# Patient Record
Sex: Female | Born: 1935 | Race: Black or African American | Hispanic: No | Marital: Married | Smoking: Never smoker
Health system: Southern US, Community
[De-identification: ages and names within clinical notes are randomized; demographics above are authoritative.]

## PROBLEM LIST (undated history)

## (undated) DIAGNOSIS — F0280 Dementia in other diseases classified elsewhere without behavioral disturbance: Secondary | ICD-10-CM

## (undated) DIAGNOSIS — I10 Essential (primary) hypertension: Secondary | ICD-10-CM

## (undated) DIAGNOSIS — F419 Anxiety disorder, unspecified: Secondary | ICD-10-CM

## (undated) DIAGNOSIS — F0390 Unspecified dementia without behavioral disturbance: Secondary | ICD-10-CM

## (undated) DIAGNOSIS — R1310 Dysphagia, unspecified: Secondary | ICD-10-CM

## (undated) DIAGNOSIS — E119 Type 2 diabetes mellitus without complications: Secondary | ICD-10-CM

## (undated) DIAGNOSIS — F329 Major depressive disorder, single episode, unspecified: Secondary | ICD-10-CM

## (undated) DIAGNOSIS — N183 Chronic kidney disease, stage 3 unspecified: Secondary | ICD-10-CM

## (undated) DIAGNOSIS — K219 Gastro-esophageal reflux disease without esophagitis: Secondary | ICD-10-CM

## (undated) DIAGNOSIS — M1990 Unspecified osteoarthritis, unspecified site: Secondary | ICD-10-CM

## (undated) HISTORY — DX: Unspecified osteoarthritis, unspecified site: M19.90

## (undated) HISTORY — DX: Gastro-esophageal reflux disease without esophagitis: K21.9

## (undated) HISTORY — DX: Chronic kidney disease, stage 3 unspecified: N18.30

## (undated) HISTORY — DX: Major depressive disorder, single episode, unspecified: F32.9

## (undated) HISTORY — DX: Dementia in other diseases classified elsewhere, unspecified severity, without behavioral disturbance, psychotic disturbance, mood disturbance, and anxiety: F02.80

## (undated) HISTORY — DX: Anxiety disorder, unspecified: F41.9

## (undated) HISTORY — DX: Dysphagia, unspecified: R13.10

---

## 2003-02-22 ENCOUNTER — Other Ambulatory Visit: Payer: Self-pay

## 2004-09-11 ENCOUNTER — Ambulatory Visit: Payer: Self-pay | Admitting: Nurse Practitioner

## 2004-09-24 ENCOUNTER — Ambulatory Visit: Payer: Self-pay | Admitting: Family Medicine

## 2016-11-11 ENCOUNTER — Emergency Department: Payer: Medicare Other

## 2016-11-11 ENCOUNTER — Observation Stay
Admission: EM | Admit: 2016-11-11 | Discharge: 2016-11-12 | Disposition: A | Discharge status: Home / Self Care | Payer: Medicare Other | Attending: Internal Medicine | Admitting: Internal Medicine

## 2016-11-11 ENCOUNTER — Encounter: Payer: Self-pay | Admitting: Emergency Medicine

## 2016-11-11 DIAGNOSIS — K219 Gastro-esophageal reflux disease without esophagitis: Secondary | ICD-10-CM | POA: Insufficient documentation

## 2016-11-11 DIAGNOSIS — I10 Essential (primary) hypertension: Secondary | ICD-10-CM | POA: Diagnosis not present

## 2016-11-11 DIAGNOSIS — R4182 Altered mental status, unspecified: Secondary | ICD-10-CM | POA: Diagnosis not present

## 2016-11-11 DIAGNOSIS — R531 Weakness: Secondary | ICD-10-CM | POA: Diagnosis not present

## 2016-11-11 DIAGNOSIS — M6281 Muscle weakness (generalized): Secondary | ICD-10-CM | POA: Insufficient documentation

## 2016-11-11 DIAGNOSIS — Z9181 History of falling: Secondary | ICD-10-CM | POA: Insufficient documentation

## 2016-11-11 DIAGNOSIS — R262 Difficulty in walking, not elsewhere classified: Secondary | ICD-10-CM | POA: Diagnosis not present

## 2016-11-11 DIAGNOSIS — E119 Type 2 diabetes mellitus without complications: Secondary | ICD-10-CM | POA: Diagnosis not present

## 2016-11-11 DIAGNOSIS — F0390 Unspecified dementia without behavioral disturbance: Secondary | ICD-10-CM | POA: Insufficient documentation

## 2016-11-11 DIAGNOSIS — R410 Disorientation, unspecified: Secondary | ICD-10-CM | POA: Diagnosis present

## 2016-11-11 DIAGNOSIS — Z79899 Other long term (current) drug therapy: Secondary | ICD-10-CM | POA: Diagnosis not present

## 2016-11-11 DIAGNOSIS — I639 Cerebral infarction, unspecified: Secondary | ICD-10-CM

## 2016-11-11 DIAGNOSIS — Z66 Do not resuscitate: Secondary | ICD-10-CM | POA: Insufficient documentation

## 2016-11-11 HISTORY — DX: Unspecified dementia, unspecified severity, without behavioral disturbance, psychotic disturbance, mood disturbance, and anxiety: F03.90

## 2016-11-11 HISTORY — DX: Essential (primary) hypertension: I10

## 2016-11-11 HISTORY — DX: Type 2 diabetes mellitus without complications: E11.9

## 2016-11-11 LAB — URINALYSIS, COMPLETE (UACMP) WITH MICROSCOPIC
BACTERIA UA: NONE SEEN
BILIRUBIN URINE: NEGATIVE
GLUCOSE, UA: 150 mg/dL — AB
Hgb urine dipstick: NEGATIVE
KETONES UR: NEGATIVE mg/dL
LEUKOCYTES UA: NEGATIVE
NITRITE: NEGATIVE
PH: 8 (ref 5.0–8.0)
Protein, ur: 100 mg/dL — AB
Specific Gravity, Urine: 1.005 (ref 1.005–1.030)
WBC, UA: NONE SEEN WBC/hpf (ref 0–5)

## 2016-11-11 LAB — CBC WITH DIFFERENTIAL/PLATELET
Basophils Absolute: 0 10*3/uL (ref 0–0.1)
Basophils Relative: 1 %
EOS ABS: 0 10*3/uL (ref 0–0.7)
EOS PCT: 1 %
HCT: 36 % (ref 35.0–47.0)
Hemoglobin: 12 g/dL (ref 12.0–16.0)
LYMPHS ABS: 0.8 10*3/uL — AB (ref 1.0–3.6)
Lymphocytes Relative: 18 %
MCH: 31.9 pg (ref 26.0–34.0)
MCHC: 33.3 g/dL (ref 32.0–36.0)
MCV: 95.7 fL (ref 80.0–100.0)
Monocytes Absolute: 0.4 10*3/uL (ref 0.2–0.9)
Monocytes Relative: 9 %
Neutro Abs: 3.3 10*3/uL (ref 1.4–6.5)
Neutrophils Relative %: 71 %
PLATELETS: 165 10*3/uL (ref 150–440)
RBC: 3.76 MIL/uL — AB (ref 3.80–5.20)
RDW: 12.8 % (ref 11.5–14.5)
WBC: 4.6 10*3/uL (ref 3.6–11.0)

## 2016-11-11 LAB — COMPREHENSIVE METABOLIC PANEL
ALT: 49 U/L (ref 14–54)
ANION GAP: 7 (ref 5–15)
AST: 43 U/L — ABNORMAL HIGH (ref 15–41)
Albumin: 3.9 g/dL (ref 3.5–5.0)
Alkaline Phosphatase: 52 U/L (ref 38–126)
BUN: 21 mg/dL — ABNORMAL HIGH (ref 6–20)
CO2: 28 mmol/L (ref 22–32)
Calcium: 9.3 mg/dL (ref 8.9–10.3)
Chloride: 102 mmol/L (ref 101–111)
Creatinine, Ser: 0.94 mg/dL (ref 0.44–1.00)
GFR calc non Af Amer: 51 mL/min — ABNORMAL LOW (ref >60)
GFR, EST AFRICAN AMERICAN: 59 mL/min — AB (ref >60)
GLUCOSE: 234 mg/dL — AB (ref 65–99)
POTASSIUM: 3.9 mmol/L (ref 3.5–5.1)
SODIUM: 137 mmol/L (ref 135–145)
TOTAL PROTEIN: 6.8 g/dL (ref 6.5–8.1)
Total Bilirubin: 0.6 mg/dL (ref 0.3–1.2)

## 2016-11-11 LAB — TROPONIN I

## 2016-11-11 LAB — GLUCOSE, CAPILLARY: Glucose-Capillary: 223 mg/dL — ABNORMAL HIGH (ref 65–99)

## 2016-11-11 MED ORDER — ENOXAPARIN SODIUM 30 MG/0.3ML ~~LOC~~ SOLN
30.0000 mg | SUBCUTANEOUS | Status: DC
  Administered 2016-11-12: 01:00:00 30 mg via SUBCUTANEOUS
  Filled 2016-11-11: qty 0.3

## 2016-11-11 MED ORDER — LISINOPRIL 20 MG PO TABS
20.0000 mg | ORAL_TABLET | Freq: Every day | ORAL | Status: DC
  Administered 2016-11-12: 20 mg via ORAL
  Filled 2016-11-11: qty 1

## 2016-11-11 MED ORDER — TRAZODONE HCL 50 MG PO TABS
25.0000 mg | ORAL_TABLET | Freq: Every day | ORAL | Status: DC
Start: 2016-11-11 — End: 2016-11-12

## 2016-11-11 MED ORDER — SODIUM CHLORIDE 0.9 % IV SOLN
Freq: Once | INTRAVENOUS | Status: AC
  Administered 2016-11-11: 18:00:00 via INTRAVENOUS

## 2016-11-11 MED ORDER — ONDANSETRON HCL 4 MG PO TABS: 4.0000 mg | ORAL_TABLET | Freq: Four times a day (QID) | ORAL | Status: DC | PRN

## 2016-11-11 MED ORDER — HYDRALAZINE HCL 20 MG/ML IJ SOLN
10.0000 mg | Freq: Four times a day (QID) | INTRAMUSCULAR | Status: DC | PRN
  Administered 2016-11-11: 10 mg via INTRAVENOUS
  Filled 2016-11-11: qty 1

## 2016-11-11 MED ORDER — AMLODIPINE BESYLATE 5 MG PO TABS
2.5000 mg | ORAL_TABLET | Freq: Every day | ORAL | Status: DC
  Administered 2016-11-12: 2.5 mg via ORAL
  Filled 2016-11-11: qty 1

## 2016-11-11 MED ORDER — PANTOPRAZOLE SODIUM 40 MG PO TBEC
40.0000 mg | DELAYED_RELEASE_TABLET | Freq: Every day | ORAL | Status: DC
  Administered 2016-11-12: 40 mg via ORAL
  Filled 2016-11-11: qty 1

## 2016-11-11 MED ORDER — ONDANSETRON HCL 4 MG/2ML IJ SOLN: 4.0000 mg | Freq: Four times a day (QID) | INTRAMUSCULAR | Status: DC | PRN

## 2016-11-11 MED ORDER — ACETAMINOPHEN 650 MG RE SUPP: 650.0000 mg | Freq: Four times a day (QID) | RECTAL | Status: DC | PRN

## 2016-11-11 MED ORDER — HALOPERIDOL LACTATE 5 MG/ML IJ SOLN: 2.5000 mg | Freq: Four times a day (QID) | INTRAMUSCULAR | Status: DC | PRN

## 2016-11-11 MED ORDER — ACETAMINOPHEN 325 MG PO TABS: 650.0000 mg | ORAL_TABLET | Freq: Four times a day (QID) | ORAL | Status: DC | PRN

## 2016-11-11 NOTE — ED Notes (Signed)
Report to Nettie ElmSylvia, Charity fundraiserN, denies concerns at this time. Encouraged to call back with questions

## 2016-11-11 NOTE — ED Provider Notes (Signed)
[facility] Emergency Department Provider Note       Time seen: ----------------------------------------- 4:16 PM on 11/11/2016 -----------------------------------------     I have reviewed the triage vital signs and the nursing notes.   HISTORY   Chief Complaint Possible stroke    HPI Bonnie Coffey is a 83 y.o. female who presents to the ED for confusion and concerns for stroke. [facility]. The family reports they took her to the [employer] to have lunch and she was confused. Patient reports his soup had upset her stomach. There is not any specific weakness, speech difficulty or focal neurologic complaint. Family states she is more confused than normal perhaps.   Past Medical History:  Diagnosis Date  . Dementia   . Diabetes mellitus without complication (HCC)   . Hypertension     There are no active problems to display for this patient.   No past surgical history on file.  Allergies Patient has no known allergies.  Social History Social History  Substance Use Topics  . Smoking status: Not on file  . Smokeless tobacco: Not on file  . Alcohol use Not on file    Review of Systems Unknown, baseline history of dementia, currently with some confusion.  All systems negative/normal/unremarkable except as stated in the HPI  ____________________________________________   PHYSICAL EXAM:  VITAL SIGNS: ED Triage Vitals [11/11/16 1610]  Enc Vitals Group     BP (!) 169/72     Pulse Rate 76     Resp 18     Temp 98.5 F (36.9 C)     Temp Source Oral     SpO2 100 %     Weight 103 lb (46.7 kg)     Height 5' (1.524 m)     Head Circumference      Peak Flow      Pain Score      Pain Loc      Pain Edu?      Excl. in GC?     Constitutional: Alert but disoriented. Well appearing and in no distress. Eyes: Conjunctivae are normal. Normal extraocular movements. ENT   Head: Normocephalic and  atraumatic.   Nose: No congestion/rhinnorhea.   Mouth/Throat: Mucous membranes are moist.   Neck: No stridor. Cardiovascular: Normal rate, regular rhythm. No murmurs, rubs, or gallops. Respiratory: Normal respiratory effort without tachypnea nor retractions. Breath sounds are clear and equal bilaterally. No wheezes/rales/rhonchi. Gastrointestinal: Soft and nontender. Normal bowel sounds Musculoskeletal: Nontender with normal range of motion in extremities. No lower extremity tenderness nor edema. Neurologic:  Confusion, No gross focal neurologic deficits are appreciated. Patient has difficulty following commands for neurologic testing Skin:  Skin is warm, dry and intact. No rash noted. Psychiatric: Mood and affect are normal.  ____________________________________________  EKG: Interpreted by me. Sinus rhythm with a rate of 79 bpm, normal PR interval, normal QRS, normal QT. Nonspecific ST segment changes.  ____________________________________________  ED COURSE:  Pertinent labs & imaging results that were available during my care of the patient were reviewed by me and considered in my medical decision making (see chart for details). Patient presents for confusion, we will assess with labs and imaging as indicated.   Procedures ____________________________________________   LABS (pertinent positives/negatives)  Labs Reviewed  CBC WITH DIFFERENTIAL/PLATELET - Abnormal; Notable for the following:       Result Value   RBC 3.76 (*)    Lymphs Abs 0.8 (*)  All other components within normal limits  COMPREHENSIVE METABOLIC PANEL - Abnormal; Notable for the following:    Glucose, Bld 234 (*)    BUN 21 (*)    AST 43 (*)    GFR calc non Af Amer 51 (*)    GFR calc Af Amer 59 (*)    All other components within normal limits  URINALYSIS, COMPLETE (UACMP) WITH MICROSCOPIC - Abnormal; Notable for the following:    Color, Urine COLORLESS (*)    APPearance CLEAR (*)    Glucose, UA  150 (*)    Protein, ur 100 (*)    Squamous Epithelial / LPF 0-5 (*)    All other components within normal limits  GLUCOSE, CAPILLARY - Abnormal; Notable for the following:    Glucose-Capillary 223 (*)    All other components within normal limits  TROPONIN I  CBG MONITORING, ED    RADIOLOGY Images were viewed by me  CT head IMPRESSION: 1. No evidence of acute intracranial abnormality. 2. Moderate chronic small vessel ischemic disease.  ____________________________________________  FINAL ASSESSMENT AND PLAN  Altered mental status  Plan: Patient's labs and imaging were dictated above. Patient had presented for Altered mental status of uncertain etiology, likely secondary to dementia. Her labs and workup have been reassuring. I will discuss with the hospitalist for observation.   Emily FilbertWilliams, Ebone Alcivar E, MD   Note: This note was generated in part or whole with voice recognition software. Voice recognition is usually quite accurate but there are transcription errors that can and very often do occur. I apologize for any typographical errors that were not detected and corrected.     Emily FilbertWilliams, Kaliope Quinonez E, MD 11/11/16 [phone]

## 2016-11-11 NOTE — Progress Notes (Signed)
Pharmacy dose adjustment for lovenox. Patient admitted for AMS. Being started on DVT prophylaxis w/ lovenox. Ordered dose lovenox 40 mg subq daily.  Pt's CrCl 26.9 ml/min.  Will decrease dose to lovenox 30 mg subq daily for CrCl < 30 ml/min.  Thomasene Rippleavid Balinda Heacock, PharmD, BCPS Clinical Pharmacist 11/11/2016

## 2016-11-11 NOTE — H&P (Signed)
[facility] [place] [facility]   PATIENT NAME: Bonnie Coffey    MR#:  [id number]  DATE OF BIRTH:  Feb 08, 1921  DATE OF ADMISSION:  11/11/2016  PRIMARY CARE PHYSICIAN: [facility]   REQUESTING/REFERRING PHYSICIAN: Dr. Daryel NovemberJonathan Williams  CHIEF COMPLAINT:   Chief Complaint  Patient presents with  . Possible stroke    HISTORY OF PRESENT ILLNESS:  Bonnie MantleLillie Haluska  is a 83 y.o. female with a known history of Dementia, diabetes, hypertension who presents to the hospital due to altered mental status. Patient was brought into the hospital by her family as she herself is a very poor historian. As per the family patient is not her usual self today. She normally is able to communicate but today she's been more lethargic, she's had some slurred speech, and she seems more confused than normal. She is able to normally recognize people and she can't do that today either. She came to the ER underwent an extensive workup including a CT head, metabolic workup which has so far been negative. She has no other infectious process. She continues to be somewhat altered and family does not feel comfortable taking her home and therefore hospitalist services were contacted further treatment and evaluation. Patient incidentally was also noted to be significantly hypertensive while being in the emergency room.  PAST MEDICAL HISTORY:   Past Medical History:  Diagnosis Date  . Dementia   . Diabetes mellitus without complication (HCC)   . Hypertension     PAST SURGICAL HISTORY:  No past surgical history on file.  SOCIAL HISTORY:   Social History  Substance Use Topics  . Smoking status: Never Smoker  . Smokeless tobacco: Not on file  . Alcohol use No    FAMILY HISTORY:   Family History  Problem Relation Age of Onset  . Heart disease Father     DRUG ALLERGIES:  No Known Allergies  REVIEW OF SYSTEMS:   Review of Systems  Unable to perform ROS: Dementia     MEDICATIONS AT HOME:   Prior to Admission medications   Medication Sig Start Date End Date Taking? Authorizing Provider  amLODipine (NORVASC) 2.5 MG tablet Take 2.5 mg by mouth daily. 10/21/16  Yes [provider]  lisinopril (PRINIVIL,ZESTRIL) 20 MG tablet Take 20 mg by mouth daily. 10/22/16  Yes [provider]  omeprazole (PRILOSEC) 20 MG capsule Take 20 mg by mouth daily. 10/22/16  Yes [provider]  traZODone (DESYREL) 50 MG tablet Take 25 mg by mouth at bedtime. 09/29/16  Yes [provider]      VITAL SIGNS:  Blood pressure (!) 169/72, pulse 89, temperature 98.5 F (36.9 C), temperature source Oral, resp. rate 14, height 5' (1.524 m), weight 46.7 kg (103 lb), SpO2 100 %.  PHYSICAL EXAMINATION:  Physical Exam  GENERAL:  83 y.o.-year-old patient lying in the bed Lethargic but in no apparent distress and follows simple commands.  EYES: Pupils equal, round, reactive to light and accommodation. No scleral icterus. Extraocular muscles intact.  HEENT: Head atraumatic, normocephalic. Oropharynx and nasopharynx clear. No oropharyngeal erythema, moist oral mucosa  NECK:  Supple, no jugular venous distention. No thyroid enlargement, no tenderness.  LUNGS: Normal breath sounds bilaterally, no wheezing, rales, rhonchi. No use of accessory muscles of respiration.  CARDIOVASCULAR: S1, S2 RRR. No murmurs, rubs, gallops, clicks.  ABDOMEN: Soft, nontender, nondistended. Bowel sounds present. No organomegaly or mass.  EXTREMITIES: No pedal edema, cyanosis, or clubbing. + 2 pedal & radial  pulses b/l.   NEUROLOGIC: Cranial nerves II through XII are intact. No focal Motor or sensory deficits appreciated b/l. Globally weak.  PSYCHIATRIC: The patient is alert and oriented x 1. SKIN: No obvious rash, lesion, or ulcer.   LABORATORY PANEL:   CBC  Recent Labs Lab 11/11/16 1620  WBC 4.6  HGB 12.0  HCT 36.0  PLT 165    ------------------------------------------------------------------------------------------------------------------  Chemistries   Recent Labs Lab 11/11/16 1620  NA 137  K 3.9  CL 102  CO2 28  GLUCOSE 234*  BUN 21*  CREATININE 0.94  CALCIUM 9.3  AST 43*  ALT 49  ALKPHOS 52  BILITOT 0.6   ------------------------------------------------------------------------------------------------------------------  Cardiac Enzymes  Recent Labs Lab 11/11/16 1620  TROPONINI <0.03   ------------------------------------------------------------------------------------------------------------------  RADIOLOGY:  Ct Head Wo Contrast  Result Date: 11/11/2016 CLINICAL DATA:  Altered mental status.  Possible stroke. EXAM: CT HEAD WITHOUT CONTRAST TECHNIQUE: Contiguous axial images were obtained from the base of the skull through the vertex without intravenous contrast. COMPARISON:  None. FINDINGS: Brain: There is no evidence of acute infarct, intracranial hemorrhage, mass, midline shift, or extra-axial fluid collection. Age-appropriate cerebral atrophy is present. Patchy to confluent bilateral cerebral white matter hypodensities are nonspecific but compatible with moderate chronic small vessel ischemic disease. Vascular: Calcified atherosclerosis at the skullbase. No hyperdense vessel. Skull: No fracture or focal osseous lesion. Sinuses/Orbits: Visualized paranasal sinuses and mastoid air cells are clear. Visualized orbits are unremarkable. Other: None. IMPRESSION: 1. No evidence of acute intracranial abnormality. 2. Moderate chronic small vessel ischemic disease. Electronically Signed   By: Sebastian AcheAllen  Grady M.D.   On: 11/11/2016 17:11     IMPRESSION AND PLAN:   83 year old female with past history of dementia, hypertension, diabetes and presents to the hospital due to altered mental status.  1. Altered mental status-etiology unclear presently. CT head negative on admission, urinalysis negative for  UTI. -Suspected this is secondary to progressive dementia. -We'll observe overnight, get MRI of the brain to rule out CVA and she is slightly hypertensive. -Social work consult for possible placement, physical therapy consult.  2. Essential hypertension-slightly accelerated now. -Continue amlodipine, lisinopril, will add some IV hydralazine as needed.  3. GERD-cont. Protonix.   4. Dementia-high risk for sundowning. Continue trazodone, will add some IV Haldol for agitation.    All the records are reviewed and case discussed with ED provider. Management plans discussed with the patient, family and they are in agreement.  CODE STATUS: DNR  TOTAL TIME TAKING CARE OF THIS PATIENT: 40 minutes.    Houston SirenSAINANI,VIVEK J M.D on 11/11/2016 at 8:10 PM  Between 7am to 6pm - Pager - [phone]  After 6pm go to [email or web address] - password [email or web address] [email or web address]  [facility]  Office  [phone]  CC: Primary care physician; [facility]

## 2016-11-11 NOTE — ED Triage Notes (Signed)
Pt sent from [facility] for evaluation of possible stroke. Pt family reports they took her out today to eat at approximately 1:00 pm and pt told them she didn't feel well and seemed more confused than normal. Pt niece states she did not know how to use utensils at lunch and this is new. Pt with bilateral strong hand grips, facial symmetry and no arm drift noted in triage.

## 2016-11-11 NOTE — ED Notes (Signed)
Assisted the patient with urinating via the bed pan.

## 2016-11-12 ENCOUNTER — Observation Stay: Payer: Medicare Other

## 2016-11-12 DIAGNOSIS — R4182 Altered mental status, unspecified: Secondary | ICD-10-CM | POA: Diagnosis not present

## 2016-11-12 MED ORDER — AMLODIPINE BESYLATE 5 MG PO TABS: 5.0000 mg | ORAL_TABLET | Freq: Every day | ORAL | 0 refills | Status: AC

## 2016-11-12 NOTE — Care Management Obs Status (Signed)
MEDICARE OBSERVATION STATUS NOTIFICATION   Patient Details  Name: Bonnie Coffey MRN: [id number] Date of Birth: November 10, 1920   Medicare Observation Status Notification Given:  Yes    Marily MemosLisa M Jomaira Darr, RN 11/12/2016, 8:41 AM

## 2016-11-12 NOTE — Care Management Note (Signed)
Case Management Note  Patient Details  Name: Bonnie Coffey MRN: [id number] Date of Birth: 10/17/1920  Subjective/Objective:  Observation patient. No home health resumption of care needed.   Action/Plan:   Expected Discharge Date:  11/13/16               Expected Discharge Plan:  Home w Home Health Services  In-House Referral:     Discharge planning Services  CM Consult  Post Acute Care Choice:  Home Health Choice offered to:     DME Arranged:    DME Agency:     HH Arranged:    HH Agency:     Status of Service:  Completed, signed off  If discussed at [employer] Meetings, dates discussed:    Additional Comments:  Bonnie MemosLisa M Meris Reede, RN 11/12/2016, 3:43 PM

## 2016-11-12 NOTE — Discharge Summary (Signed)
[facility] [place] at [facility]   PATIENT NAME: Bonnie Coffey    MR#:  [id number]  DATE OF BIRTH:  11/06/1920  DATE OF ADMISSION:  11/11/2016 ADMITTING PHYSICIAN: Houston Siren, MD  DATE OF DISCHARGE: 11/12/2016  PRIMARY CARE PHYSICIAN: Center, [facility]    ADMISSION DIAGNOSIS:  Altered mental status, unspecified altered mental status type [R41.82]  DISCHARGE DIAGNOSIS:  Active Problems:   Altered mental status   SECONDARY DIAGNOSIS:   Past Medical History:  Diagnosis Date  . Dementia   . Diabetes mellitus without complication (HCC)   . Hypertension     HOSPITAL COURSE:   83 year old female with past history of dementia, hypertension, diabetes and presents to the hospital due to altered mental status.  1. Altered mental status-etiology unclear presently. CT head negative on admission, urinalysis negative for UTI. -Suspected this is secondary to progressive dementia. - MRI of the brain to rule out CVA - negative. - BP was very high, likely the symptoms were due to that. - PT saw her- suggested HHA and 24 hr assist.  2. Essential hypertension- accelerated on admission. -Continue amlodipine, lisinopril, add some IV hydralazine as needed.   Now controlled, increase amlodipine dose on d/c.  3. GERD-cont. Protonix.   4. Dementia-high risk for sundowning. Continue trazodone, will add some IV Haldol for agitation.  DISCHARGE CONDITIONS:   Stable.  CONSULTS OBTAINED:    DRUG ALLERGIES:  No Known Allergies  DISCHARGE MEDICATIONS:   Current Discharge Medication List    CONTINUE these medications which have CHANGED   Details  amLODipine (NORVASC) 5 MG tablet Take 1 tablet (5 mg total) by mouth daily. Qty: 30 tablet, Refills: 0      CONTINUE these medications which have NOT CHANGED   Details  lisinopril (PRINIVIL,ZESTRIL) 20 MG tablet Take 20 mg by mouth daily.    omeprazole (PRILOSEC) 20 MG capsule Take 20 mg  by mouth daily.    traZODone (DESYREL) 50 MG tablet Take 25 mg by mouth at bedtime.         DISCHARGE INSTRUCTIONS:   Follow with PMD in 1-2 weeks.  If you experience worsening of your admission symptoms, develop shortness of breath, life threatening emergency, suicidal or homicidal thoughts you must seek medical attention immediately by calling 911 or calling your MD immediately  if symptoms less severe.  You Must read complete instructions/literature along with all the possible adverse reactions/side effects for all the Medicines you take and that have been prescribed to you. Take any new Medicines after you have completely understood and accept all the possible adverse reactions/side effects.   Please note  You were cared for by a hospitalist during your hospital stay. If you have any questions about your discharge medications or the care you received while you were in the hospital after you are discharged, you can call the unit and asked to speak with the hospitalist on call if the hospitalist that took care of you is not available. Once you are discharged, your primary care physician will handle any further medical issues. Please note that NO REFILLS for any discharge medications will be authorized once you are discharged, as it is imperative that you return to your primary care physician (or establish a relationship with a primary care physician if you do not have one) for your aftercare needs so that they can reassess your need for medications and monitor your lab values.    Today   CHIEF COMPLAINT:  Chief Complaint  Patient presents with  . Possible stroke    HISTORY OF PRESENT ILLNESS:  Bonnie Coffey  is a 83 y.o. female with a known history of Dementia, diabetes, hypertension who presents to the hospital due to altered mental status. Patient was brought into the hospital by her family as she herself is a very poor historian. As per the family patient is not her usual self  today. She normally is able to communicate but today she's been more lethargic, she's had some slurred speech, and she seems more confused than normal. She is able to normally recognize people and she can't do that today either. She came to the ER underwent an extensive workup including a CT head, metabolic workup which has so far been negative. She has no other infectious process. She continues to be somewhat altered and family does not feel comfortable taking her home and therefore hospitalist services were contacted further treatment and evaluation. Patient incidentally was also noted to be significantly hypertensive while being in the emergency room.   VITAL SIGNS:  Blood pressure (!) 156/73, pulse 80, temperature 97.7 F (36.5 C), temperature source Oral, resp. rate 16, height 5' (1.524 m), weight 43.2 kg (95 lb 3.2 oz), SpO2 100 %.  I/O:   Intake/Output Summary (Last 24 hours) at 11/12/16 1544 Last data filed at 11/12/16 1352  Gross per 24 hour  Intake              360 ml  Output             1000 ml  Net             -640 ml    PHYSICAL EXAMINATION:  GENERAL:  83 y.o.-year-old patient lying in the bed with no acute distress.  EYES: Pupils equal, round, reactive to light and accommodation. No scleral icterus. Extraocular muscles intact.  HEENT: Head atraumatic, normocephalic. Oropharynx and nasopharynx clear.  NECK:  Supple, no jugular venous distention. No thyroid enlargement, no tenderness.  LUNGS: Normal breath sounds bilaterally, no wheezing, rales,rhonchi or crepitation. No use of accessory muscles of respiration.  CARDIOVASCULAR: S1, S2 normal. No murmurs, rubs, or gallops.  ABDOMEN: Soft, non-tender, non-distended. Bowel sounds present. No organomegaly or mass.  EXTREMITIES: No pedal edema, cyanosis, or clubbing.  NEUROLOGIC: Cranial nerves II through XII are intact. Muscle strength 5/5 in all extremities. Sensation intact. Gait not checked.  PSYCHIATRIC: The patient is alert  and oriented x 2.  SKIN: No obvious rash, lesion, or ulcer.   DATA REVIEW:   CBC  Recent Labs Lab 11/11/16 1620  WBC 4.6  HGB 12.0  HCT 36.0  PLT 165    Chemistries   Recent Labs Lab 11/11/16 1620  NA 137  K 3.9  CL 102  CO2 28  GLUCOSE 234*  BUN 21*  CREATININE 0.94  CALCIUM 9.3  AST 43*  ALT 49  ALKPHOS 52  BILITOT 0.6    Cardiac Enzymes  Recent Labs Lab 11/11/16 1620  TROPONINI <0.03    Microbiology Results  No results found for this or any previous visit.  RADIOLOGY:  Ct Head Wo Contrast  Result Date: 11/11/2016 CLINICAL DATA:  Altered mental status.  Possible stroke. EXAM: CT HEAD WITHOUT CONTRAST TECHNIQUE: Contiguous axial images were obtained from the base of the skull through the vertex without intravenous contrast. COMPARISON:  None. FINDINGS: Brain: There is no evidence of acute infarct, intracranial hemorrhage, mass, midline shift, or extra-axial fluid collection. Age-appropriate cerebral atrophy is present.  Patchy to confluent bilateral cerebral white matter hypodensities are nonspecific but compatible with moderate chronic small vessel ischemic disease. Vascular: Calcified atherosclerosis at the skullbase. No hyperdense vessel. Skull: No fracture or focal osseous lesion. Sinuses/Orbits: Visualized paranasal sinuses and mastoid air cells are clear. Visualized orbits are unremarkable. Other: None. IMPRESSION: 1. No evidence of acute intracranial abnormality. 2. Moderate chronic small vessel ischemic disease. Electronically Signed   By: Sebastian AcheAllen  Grady M.D.   On: 11/11/2016 17:11   Mr Brain Wo Contrast  Result Date: 11/12/2016 CLINICAL DATA:  Cerebral infarction. Dementia, diabetes, hypertension, altered mental status EXAM: MRI HEAD WITHOUT CONTRAST TECHNIQUE: Multiplanar, multiecho pulse sequences of the brain and surrounding structures were obtained without intravenous contrast. COMPARISON:  CT head 11/11/2016 FINDINGS: Brain: Image quality degraded by  motion. Negative for acute infarct. Generalized atrophy with moderate chronic microvascular ischemic changes in the white matter, thalamus, and pons. Negative for hemorrhage or mass lesion. Negative for hydrocephalus. Vascular: Normal arterial flow voids. Skull and upper cervical spine: Negative Sinuses/Orbits: Negative Other: None IMPRESSION: Atrophy and chronic microvascular ischemia.  No acute abnormality. Electronically Signed   By: Marlan Palauharles  Clark M.D.   On: 11/12/2016 12:26    EKG:   Orders placed or performed during the hospital encounter of 11/11/16  . ED EKG  . ED EKG  . EKG 12-Lead  . EKG 12-Lead      Management plans discussed with the patient, family and they are in agreement.  CODE STATUS:     Code Status Orders        Start     Ordered   11/12/16 0231  Do not attempt resuscitation (DNR)  Continuous    Question Answer Comment  In the event of cardiac or respiratory ARREST Do not call a "code blue"   In the event of cardiac or respiratory ARREST Do not perform Intubation, CPR, defibrillation or ACLS   In the event of cardiac or respiratory ARREST Use medication by any route, position, wound care, and other measures to relive pain and suffering. May use oxygen, suction and manual treatment of airway obstruction as needed for comfort.      11/12/16 0230    Code Status History    Date Active Date Inactive Code Status Order ID Comments User Context   11/11/2016 11:38 PM 11/12/2016  2:30 AM Full Code [id number]  Houston SirenSainani, Vivek J, MD Inpatient      TOTAL TIME TAKING CARE OF THIS PATIENT: 35 minutes.    Altamese DillingVACHHANI, Kaleo Condrey M.D on 11/12/2016 at 3:44 PM  Between 7am to 6pm - Pager - [phone]  After 6pm go to [email or web address] - Social research officer, governmentpassword [email or web address]  [facility]  Office  [phone]  CC: Primary care physician; [facility]   Note: This dictation was prepared with Dragon dictation along with smaller phrase technology. Any  transcriptional errors that result from this process are unintentional.

## 2016-11-12 NOTE — Evaluation (Signed)
Physical Therapy Evaluation Patient Details Name: Regina EckLillie B Golonka MRN: [id number] DOB: 1920/05/09 Today's Date: 11/12/2016   History of Present Illness  Pt is a 83 y.o. female presenting to hospital with confusion and concerns of stroke.  CT and MRI of head negative for acute intracranial abnormalities.  PMH includes dementia, DM, and htn.  Clinical Impression  Prior to hospital admission, pt was ambulating by herself with RW vs SPC depending on the situation and how the pt was feeling.  Pt lives alone but has 24/7 caregiver assist.  Currently pt is SBA supine to sit and CGA with transfers and ambulation with AD around the nursing station.  Pt had difficulty navigating with RW but did better with use of SPC during session.  Pt has been receiving HHPT for strengthening and balance impairments.  Pt would benefit from skilled PT to address noted impairments and functional limitations (see below for any additional details).  Upon hospital discharge, recommend pt discharge to home with 24/7 supervision and assist as needed (and continuing HHPT).    Follow Up Recommendations Home health PT;Supervision/Assistance - 24 hour    Equipment Recommendations  Rolling walker with 5" wheels;Cane;Other (comment) (Pt already has RW and SPC)    Recommendations for Other Services       Precautions / Restrictions Precautions Precautions: Fall Restrictions Weight Bearing Restrictions: No      Mobility  Bed Mobility Overal bed mobility: Needs Assistance Bed Mobility: Supine to Sit     Supine to sit: Supervision;HOB elevated     General bed mobility comments: SBA for safety but no physical assist required  Transfers Overall transfer level: Needs assistance Equipment used: Straight cane Transfers: Sit to/from Stand Sit to Stand: Min guard         General transfer comment: pt requiring a few extra attempts to stand (d/t initially posterior lean) but eventually able to self correct and stand on  own with RW  Ambulation/Gait Ambulation/Gait assistance: Min guard Ambulation Distance (Feet): 200 Feet (100 feet with RW; 100 feet with SPC) Assistive device: Rolling walker (2 wheeled) Gait Pattern/deviations: Step-through pattern Gait velocity: initially decreased with RW but appearing at normal [facility]   General Gait Details: decreased BOS  Stairs            Wheelchair Mobility    Modified Rankin (Stroke Patients Only)       Balance Overall balance assessment: History of Falls;Needs assistance Sitting-balance support: No upper extremity supported;Feet supported Sitting balance-Leahy Scale: Good Sitting balance - Comments: sitting reaching within BOS     Standing balance-Leahy Scale: Poor Standing balance comment: requires RW or SPC for balance with functional activities                             Pertinent Vitals/Pain Pain Assessment: No/denies pain  Vitals (HR and O2 on room air) stable and WFL throughout treatment session.    Home Living Family/patient expects to be discharged to:: Private residence Living Arrangements: Alone Available Help at Discharge: Personal care attendant;Available 24 hours/day Type of Home: House Home Access: Stairs to enter Entrance Stairs-Rails: Left Entrance Stairs-Number of Steps: 3 Home Layout: Two level;Able to live on main level with bedroom/bathroom Home Equipment: Dan HumphreysWalker - 2 wheels;Cane - single point;Toilet riser      Prior Function Level of Independence: Needs assistance   Gait / Transfers Assistance Needed: Ambulates with RW vs SPC in home and SPC in community.  Pt's family and caregiver report 1 recent fall (pt slid OOB).  ADL's / Homemaking Assistance Needed: assist for medications and meals; independent with bathing and feeding  Comments: Pt has been receiving HHPT 2x/week for strenthening and balance     Hand Dominance   Dominant Hand: Left    Extremity/Trunk Assessment   Upper  Extremity Assessment Upper Extremity Assessment: Generalized weakness    Lower Extremity Assessment Lower Extremity Assessment: Generalized weakness    Cervical / Trunk Assessment Cervical / Trunk Assessment: Normal  Communication   Communication: No difficulties  Cognition Arousal/Alertness: Awake/alert Behavior During Therapy: WFL for tasks assessed/performed Overall Cognitive Status: History of cognitive impairments - at baseline (Pt oriented to self only (baseline per pt's family and caregiver))                                        General Comments General comments (skin integrity, edema, etc.): Pt's caregiver and family present during parts of session.  Pt agreeable to PT session.    Exercises     Assessment/Plan    PT Assessment Patient needs continued PT services  PT Problem List Decreased strength;Decreased balance;Decreased mobility;Decreased knowledge of use of DME       PT Treatment Interventions DME instruction;Gait training;Stair training;Functional mobility training;Therapeutic activities;Therapeutic exercise;Balance training;Patient/family education    PT Goals (Current goals can be found in the Care Plan section)  Acute Rehab PT Goals Patient Stated Goal: to go home PT Goal Formulation: With patient/family Time For Goal Achievement: 11/26/16 Potential to Achieve Goals: Good    Frequency Min 2X/week   Barriers to discharge        Co-evaluation               AM-PAC PT "6 Clicks" Daily Activity  Outcome Measure Difficulty turning over in bed (including adjusting bedclothes, sheets and blankets)?: None Difficulty moving from lying on back to sitting on the side of the bed? : A Little Difficulty sitting down on and standing up from a chair with arms (e.g., wheelchair, bedside commode, etc,.)?: Total Help needed moving to and from a bed to chair (including a wheelchair)?: A Little Help needed walking in hospital room?: A  Little Help needed climbing 3-5 steps with a railing? : A Little 6 Click Score: 17    End of Session Equipment Utilized During Treatment: Gait belt Activity Tolerance: Patient tolerated treatment well Patient left: in chair;with call bell/phone within reach;with chair alarm set;with family/visitor present Nurse Communication: Mobility status;Precautions PT Visit Diagnosis: History of falling (Z91.81);Muscle weakness (generalized) (M62.81);Difficulty in walking, not elsewhere classified (R26.2)    Time: [id number] PT Time Calculation (min) (ACUTE ONLY): 26 min   Charges:   PT Evaluation $PT Eval Low Complexity: 1 Low     PT G Codes:   PT G-Codes **NOT FOR INPATIENT CLASS** Functional Assessment Tool Used: AM-PAC 6 Clicks Basic Mobility Functional Limitation: Mobility: Walking and moving around Mobility: Walking and Moving Around Current Status (Y7829(G8978): At least 40 percent but less than 60 percent impaired, limited or restricted Mobility: Walking and Moving Around Goal Status [phone](G8979): At least 1 percent but less than 20 percent impaired, limited or restricted    Hendricks Limesmily Shealee Yordy, PT 11/12/16, 2:52 PM [phone]

## 2016-11-12 NOTE — Care Management Note (Signed)
Case Management Note  Patient Details  Name: Regina EckLillie B Wiedel MRN: [id number] Date of Birth: 03-28-1921  Subjective/Objective:  Admitted with altered mental status. Spoke with patients neice, Ron ParkerWillie Bouffard by phone. No family at bedside. Explained observation notice. She verbalized understanding. Patient lives in her own home and has a 24 hour caregiver. Prior to admission, patient was using a walker inside the home and a cane outside the home. She was able to provide for her own adls like bathing and feeding. Per the niece patient [facility] and she verified that patient was an active patient. She states patient has an aide but niece denies this.  No home O2. [facility], patient seen in the last 2 weeks.                  Action/Plan:   Expected Discharge Date:  11/13/16               Expected Discharge Plan:     In-House Referral:     Discharge planning Services  CM Consult  Post Acute Care Choice:    Choice offered to:     DME Arranged:    DME Agency:     HH Arranged:    HH Agency:     Status of Service:     If discussed at [employer] Meetings, dates discussed:    Additional Comments:  Marily MemosLisa M Jilliann Subramanian, RN 11/12/2016, 8:29 AM

## 2016-11-12 NOTE — Clinical Social Work Note (Signed)
CSW consulted for Discharge Planning and New SNF. Pt is Medicare OBS. RNCM addressing discharge plan. Pt is ready for discharge and will return home. CSW is signing off as no further needs identified.   Dede QuerySarah Ison Wichmann, MSW, LCSW Clinical Social Worker  [phone]

## 2016-11-12 NOTE — Plan of Care (Signed)
Problem: Education: Goal: Knowledge of [facility] Education information/materials will improve Outcome: Not Progressing Pt confused, lethargic, and alert to self only.  [facility] to [facility] at this time.

## 2016-11-12 NOTE — Progress Notes (Signed)
Safety rounding done.  Pt rouses easily.  Mostly incoherent speech.  Pt appearing fearful.  Emotional reassurance given.

## 2019-12-22 ENCOUNTER — Emergency Department (HOSPITAL_COMMUNITY): Payer: Medicare Other

## 2019-12-22 ENCOUNTER — Emergency Department (HOSPITAL_COMMUNITY)
Admission: EM | Admit: 2019-12-22 | Discharge: 2019-12-23 | Disposition: A | Discharge status: Other Acute Inpatient Hospital | Payer: Medicare Other | Attending: Emergency Medicine | Admitting: Emergency Medicine

## 2019-12-22 ENCOUNTER — Other Ambulatory Visit: Payer: Self-pay

## 2019-12-22 ENCOUNTER — Encounter (HOSPITAL_COMMUNITY): Payer: Self-pay

## 2019-12-22 DIAGNOSIS — F0390 Unspecified dementia without behavioral disturbance: Secondary | ICD-10-CM | POA: Insufficient documentation

## 2019-12-22 DIAGNOSIS — Y939 Activity, unspecified: Secondary | ICD-10-CM | POA: Diagnosis not present

## 2019-12-22 DIAGNOSIS — E860 Dehydration: Secondary | ICD-10-CM | POA: Insufficient documentation

## 2019-12-22 DIAGNOSIS — Z66 Do not resuscitate: Secondary | ICD-10-CM | POA: Insufficient documentation

## 2019-12-22 DIAGNOSIS — S42201A Unspecified fracture of upper end of right humerus, initial encounter for closed fracture: Secondary | ICD-10-CM | POA: Insufficient documentation

## 2019-12-22 DIAGNOSIS — Y929 Unspecified place or not applicable: Secondary | ICD-10-CM | POA: Insufficient documentation

## 2019-12-22 DIAGNOSIS — Y999 Unspecified external cause status: Secondary | ICD-10-CM | POA: Insufficient documentation

## 2019-12-22 DIAGNOSIS — S4991XA Unspecified injury of right shoulder and upper arm, initial encounter: Secondary | ICD-10-CM | POA: Diagnosis present

## 2019-12-22 DIAGNOSIS — X58XXXA Exposure to other specified factors, initial encounter: Secondary | ICD-10-CM | POA: Insufficient documentation

## 2019-12-22 DIAGNOSIS — E119 Type 2 diabetes mellitus without complications: Secondary | ICD-10-CM | POA: Diagnosis not present

## 2019-12-22 DIAGNOSIS — I10 Essential (primary) hypertension: Secondary | ICD-10-CM | POA: Insufficient documentation

## 2019-12-22 DIAGNOSIS — Z79899 Other long term (current) drug therapy: Secondary | ICD-10-CM | POA: Insufficient documentation

## 2019-12-22 LAB — COMPREHENSIVE METABOLIC PANEL
ALT: 16 U/L (ref 0–44)
AST: 19 U/L (ref 15–41)
Albumin: 3.6 g/dL (ref 3.5–5.0)
Alkaline Phosphatase: 55 U/L (ref 38–126)
Anion gap: 9 (ref 5–15)
BUN: 32 mg/dL — ABNORMAL HIGH (ref 8–23)
CO2: 27 mmol/L (ref 22–32)
Calcium: 9.1 mg/dL (ref 8.9–10.3)
Chloride: 98 mmol/L (ref 98–111)
Creatinine, Ser: 1.15 mg/dL — ABNORMAL HIGH (ref 0.44–1.00)
GFR calc Af Amer: 47 mL/min — ABNORMAL LOW (ref >60)
GFR calc non Af Amer: 40 mL/min — ABNORMAL LOW (ref >60)
Glucose, Bld: 374 mg/dL — ABNORMAL HIGH (ref 70–99)
Potassium: 4.9 mmol/L (ref 3.5–5.1)
Sodium: 134 mmol/L — ABNORMAL LOW (ref 135–145)
Total Bilirubin: 0.6 mg/dL (ref 0.3–1.2)
Total Protein: 6.7 g/dL (ref 6.5–8.1)

## 2019-12-22 LAB — CBC WITH DIFFERENTIAL/PLATELET
Abs Immature Granulocytes: 0.03 10*3/uL (ref 0.00–0.07)
Basophils Absolute: 0 10*3/uL (ref 0.0–0.1)
Basophils Relative: 0 %
Eosinophils Absolute: 0 10*3/uL (ref 0.0–0.5)
Eosinophils Relative: 0 %
HCT: 30.8 % — ABNORMAL LOW (ref 36.0–46.0)
Hemoglobin: 9.8 g/dL — ABNORMAL LOW (ref 12.0–15.0)
Immature Granulocytes: 0 %
Lymphocytes Relative: 9 %
Lymphs Abs: 0.7 10*3/uL (ref 0.7–4.0)
MCH: 30.9 pg (ref 26.0–34.0)
MCHC: 31.8 g/dL (ref 30.0–36.0)
MCV: 97.2 fL (ref 80.0–100.0)
Monocytes Absolute: 0.6 10*3/uL (ref 0.1–1.0)
Monocytes Relative: 8 %
Neutro Abs: 6.5 10*3/uL (ref 1.7–7.7)
Neutrophils Relative %: 83 %
Platelets: 213 10*3/uL (ref 150–400)
RBC: 3.17 MIL/uL — ABNORMAL LOW (ref 3.87–5.11)
RDW: 10.9 % — ABNORMAL LOW (ref 11.5–15.5)
WBC: 7.9 10*3/uL (ref 4.0–10.5)
nRBC: 0 % (ref 0.0–0.2)

## 2019-12-22 LAB — CBG MONITORING, ED: Glucose-Capillary: 410 mg/dL — ABNORMAL HIGH (ref 70–99)

## 2019-12-22 LAB — LACTIC ACID, PLASMA
Lactic Acid, Venous: 2.2 mmol/L (ref 0.5–1.9)
Lactic Acid, Venous: 2.1 mmol/L (ref 0.5–1.9)
Lactic Acid, Venous: 2 mmol/L (ref 0.5–1.9)

## 2019-12-22 LAB — BLOOD GAS, VENOUS
Acid-Base Excess: 3.9 mmol/L — ABNORMAL HIGH (ref 0.0–2.0)
Bicarbonate: 27.4 mmol/L (ref 20.0–28.0)
FIO2: 21
O2 Saturation: 86.2 %
Patient temperature: 37.6
pCO2, Ven: 47.8 mmHg (ref 44.0–60.0)
pH, Ven: 7.394 (ref 7.250–7.430)
pO2, Ven: 56 mmHg — ABNORMAL HIGH (ref 32.0–45.0)

## 2019-12-22 LAB — C-REACTIVE PROTEIN: CRP: 0.9 mg/dL (ref <1.0)

## 2019-12-22 LAB — SEDIMENTATION RATE: Sed Rate: 50 mm/hr — ABNORMAL HIGH (ref 0–22)

## 2019-12-22 MED ORDER — SODIUM CHLORIDE 0.9 % IV BOLUS
1000.0000 mL | Freq: Once | INTRAVENOUS | Status: AC
  Administered 2019-12-22: 1000 mL via INTRAVENOUS

## 2019-12-22 NOTE — ED Notes (Signed)
Dr. Charm Barges notified of CBG 410.

## 2019-12-22 NOTE — ED Triage Notes (Signed)
[facility] with dementia from [facility]. Staff noted to right shoulder swelling and pain today.

## 2019-12-22 NOTE — ED Provider Notes (Signed)
[facility] EMERGENCY DEPARTMENT Provider Note   CSN: [id number] Arrival date & time: 12/22/19  2694     History Chief Complaint  Patient presents with  . Shoulder Pain    Bonnie Coffey is a 86 y.o. female.  Level 5 caveat secondary to dementia.  Patient sent in by EMS from the [facility] for evaluation of right shoulder swelling and pain noted today.  No reported history of falls.  Patient is unable to give any history.  Has DNR paperwork with her.  The history is provided by the patient and the EMS personnel.  Shoulder Pain Location:  Shoulder Shoulder location:  R shoulder Upper extremity injury: unknown.   Pain details:    Quality:  Unable to specify   Severity:  Unable to specify   Onset quality:  Unable to specify   Timing:  Unable to specify Tetanus status:  Unknown Prior injury to area:  Unable to specify Relieved by:  Nothing Worsened by:  Movement Ineffective treatments:  None tried      Past Medical History:  Diagnosis Date  . Dementia (HCC)   . Diabetes mellitus without complication (HCC)   . Hypertension     Patient Active Problem List   Diagnosis Date Noted  . Altered mental status 11/11/2016    History reviewed. No pertinent surgical history.   OB History   No obstetric history on file.     Family History  Problem Relation Age of Onset  . Heart disease Father     Social History   Tobacco Use  . Smoking status: Never Smoker  Substance Use Topics  . Alcohol use: No  . Drug use: No    Home Medications Prior to Admission medications   Medication Sig Start Date End Date Taking? Authorizing Provider  amLODipine (NORVASC) 5 MG tablet Take 1 tablet (5 mg total) by mouth daily. 11/12/16   Altamese Dilling, MD  lisinopril (PRINIVIL,ZESTRIL) 20 MG tablet Take 20 mg by mouth daily. 10/22/16   [provider]  omeprazole (PRILOSEC) 20 MG capsule Take 20 mg by mouth daily. 10/22/16   [provider]  traZODone  (DESYREL) 50 MG tablet Take 25 mg by mouth at bedtime. 09/29/16   [provider]    Allergies    Patient has no known allergies.  Review of Systems   Review of Systems  Unable to perform ROS: Dementia    Physical Exam Updated Vital Signs BP (!) 181/78 (BP Location: Left Arm)   Pulse 96   Temp 99.7 F (37.6 C) (Rectal)   Resp (!) 24   SpO2 98%   Physical Exam Vitals and nursing note reviewed.  Constitutional:      General: She is not in acute distress.    Appearance: Normal appearance. She is well-developed.  HENT:     Head: Normocephalic and atraumatic.  Eyes:     Conjunctiva/sclera: Conjunctivae normal.  Cardiovascular:     Rate and Rhythm: Normal rate and regular rhythm.     Heart sounds: No murmur heard.   Pulmonary:     Effort: Pulmonary effort is normal. No respiratory distress.     Breath sounds: Normal breath sounds.  Abdominal:     Palpations: Abdomen is soft.     Tenderness: There is no abdominal tenderness.  Musculoskeletal:        General: Swelling and tenderness present.     Cervical back: Neck supple.     Comments: r shoulder  Skin:  General: Skin is warm and dry.     Capillary Refill: Capillary refill takes less than 2 seconds.  Neurological:     Mental Status: She is alert. Mental status is at baseline.     ED Results / Procedures / Treatments   Labs (all labs ordered are listed, but only abnormal results are displayed) Labs Reviewed  CBC WITH DIFFERENTIAL/PLATELET - Abnormal; Notable for the following components:      Result Value   RBC 3.17 (*)    Hemoglobin 9.8 (*)    HCT 30.8 (*)    RDW 10.9 (*)    All other components within normal limits  COMPREHENSIVE METABOLIC PANEL - Abnormal; Notable for the following components:   Sodium 134 (*)    Glucose, Bld 374 (*)    BUN 32 (*)    Creatinine, Ser 1.15 (*)    GFR calc non Af Amer 40 (*)    GFR calc Af Amer 47 (*)    All other components within normal limits  BLOOD GAS,  VENOUS - Abnormal; Notable for the following components:   pO2, Ven 56.0 (*)    Acid-Base Excess 3.9 (*)    All other components within normal limits  SEDIMENTATION RATE - Abnormal; Notable for the following components:   Sed Rate 50 (*)    All other components within normal limits  LACTIC ACID, PLASMA - Abnormal; Notable for the following components:   Lactic Acid, Venous 2.2 (*)    All other components within normal limits  LACTIC ACID, PLASMA - Abnormal; Notable for the following components:   Lactic Acid, Venous 2.1 (*)    All other components within normal limits  LACTIC ACID, PLASMA - Abnormal; Notable for the following components:   Lactic Acid, Venous 2.0 (*)    All other components within normal limits  CBG MONITORING, ED - Abnormal; Notable for the following components:   Glucose-Capillary 410 (*)    All other components within normal limits  CULTURE, BLOOD (ROUTINE X 2)  CULTURE, BLOOD (ROUTINE X 2)  URINE CULTURE  C-REACTIVE PROTEIN  LACTIC ACID, PLASMA  URINALYSIS, ROUTINE W REFLEX MICROSCOPIC    EKG None  Radiology DG Chest 1 View  Result Date: 12/22/2019 CLINICAL DATA:  Fever, shoulder pain EXAM: CHEST  1 VIEW COMPARISON:  None. FINDINGS: No focal opacity or pleural effusion. Normal cardiomediastinal silhouette with aortic atherosclerosis. No pneumothorax. Acute comminuted and displaced proximal right humerus fracture. IMPRESSION: No active disease. Acute comminuted and displaced proximal right humerus fracture. Electronically Signed   By: Jasmine Pang M.D.   On: 12/22/2019 17:42   DG Shoulder Right  Result Date: 12/22/2019 CLINICAL DATA:  Shoulder pain EXAM: RIGHT SHOULDER - 2+ VIEW COMPARISON:  None. FINDINGS: Mild AC joint degenerative change. Acute comminuted proximal humerus fracture appears to involve the neck. Valgus angulation of distal fracture fragment. About 1 shaft diameter anterior displacement of the shaft of the humerus. Humeral head appears to  project over the glenoid. Clips in the right axilla IMPRESSION: Acute comminuted, displaced and angulated proximal humerus fracture. Electronically Signed   By: Jasmine Pang M.D.   On: 12/22/2019 17:43   CT Head Wo Contrast  Result Date: 12/22/2019 CLINICAL DATA:  Head trauma, minor. Additional provided: Patient with dementia from nursing home, staff noted right shoulder swelling and pain today. EXAM: CT HEAD WITHOUT CONTRAST TECHNIQUE: Contiguous axial images were obtained from the base of the skull through the vertex without intravenous contrast. COMPARISON:  Brain MRI 11/12/2016.  head CT 11/11/2016. FINDINGS: Brain: Moderate generalized parenchymal atrophy, progressed as compared to the prior MRI of 11/12/2016. There is a chronic cortically based infarct within the right parietal lobe and right temporoparietal junction which is new as compared to the prior MRI. Progressive advanced ill-defined hypoattenuation within the cerebral white matter which is nonspecific, but consistent with chronic small vessel ischemic disease Additionally, multiple chronic lacunar infarcts within the deep gray nuclei have progressed in number as compared to this prior exam. There is no acute intracranial hemorrhage. No acute demarcated cortical infarct. No extra-axial fluid collection. No evidence of intracranial mass. No midline shift. Vascular: No hyperdense vessel.  Atherosclerotic calcifications Skull: Normal. Negative for fracture or focal lesion. Sinuses/Orbits: Visualized orbits show no acute finding. No significant paranasal sinus disease or mastoid effusion at the imaged levels IMPRESSION: No evidence of acute intracranial abnormality. 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 Additionally, chronic deep gray nuclei lacunar infarcts have progressed in number since this prior examination. Progressive background moderate  generalized parenchymal atrophy and advanced cerebral white matter disease. Electronically Signed   By: Jackey LogeKyle  Golden DO   On: 12/22/2019 20:53    Procedures Procedures (including critical care time)  Medications Ordered in ED Medications  sodium chloride 0.9 % bolus 1,000 mL (0 mLs Intravenous Stopped 12/22/19 2305)    ED Course  I have reviewed the triage vital signs and the nursing notes.  Pertinent labs & imaging results that were available during my care of the patient were reviewed by me and considered in my medical decision making (see chart for details).  Clinical Course as of Dec 22 1053  Fri Dec 22, 2019  2241 Discussed with Dr. Carola FrostHandy from orthopedics.  He reviewed her films and recommended sling and follow-up in the office.   [MB]  2318 Patient signed out to oncoming provider to follow-up on urinalysis.  May require dose of antibiotics if looks positive.  Otherwise can be returned to her facility   [MB]    Clinical Course User Index [MB] Terrilee FilesButler, Bulah Lurie C, MD   MDM Rules/Calculators/A&P                         This patient complains of right shoulder pain also has a low-grade temperature; this involves an extensive number of treatment Options and is a complaint that carries with it a high risk of complications and Morbidity. The differential includes fracture, dislocation, septic joint, pneumonia, UTI, metabolic derangement  I ordered, reviewed and interpreted labs, which included CBC with normal white count, low hemoglobin has been down there before.  Chemistries with elevated glucose, normal gap, normal pH making DKA less likely, sed rate elevated but CRP normal equivocal, blood culture sent and pending, lactate elevated and trended I ordered medication IV fluids I ordered imaging studies which included head CT, chest x-ray right shoulder x-ray and I independently    visualized and interpreted imaging which showed acute proximal humerus fracture, no  dislocation Additional history obtained from EMS Previous records obtained and reviewed in epic, no recent admissions or emergency department visits I consulted Dr. Carola FrostHandy orthopedics and discussed lab and imaging findings.  He recommended sling and outpatient follow-up  Critical Interventions: None  After the interventions stated above, I reevaluated the patient and found patient to be resting quietly in bed.  Still has not been able to get a urine from  her.  Lactate trending down.  Signed out to oncoming provider to continue fluids and trending lactate.  Hopefully we can get a urine to make sure she does not have a UTI.  Ultimately can likely return to her facility for outpatient orthopedic follow-up.   Final Clinical Impression(s) / ED Diagnoses Final diagnoses:  Closed fracture of proximal end of right humerus, unspecified fracture morphology, initial encounter  Dehydration    Rx / DC Orders ED Discharge Orders    None       Terrilee Files, MD 12/23/19 1059

## 2019-12-22 NOTE — Discharge Instructions (Signed)
You were seen in the emergency department for evaluation of a swollen and painful right shoulder.  Your x-ray shows that your proximal humerus was fractured.  Orthopedics Dr. Carola Frost recommends a sling and follow-up in the office.  He had a low-grade temperature and had some signs of dehydration.  You were given fluids with improvement in your symptoms.  You can use ice to the shoulder and please talk to your doctor about pain medication.  Return to the emergency department if any worsening or concerning symptoms.

## 2019-12-22 NOTE — ED Notes (Signed)
CRITICAL VALUE ALERT  Critical Value:  Lactic Acid 2.2  Date & Time Notied:  12/22/19 1904  Provider Notified: Dr. Charm Barges  Orders Received/Actions taken: EDP notified

## 2019-12-22 NOTE — ED Notes (Signed)
Pt taken by radiology for xray.

## 2019-12-23 LAB — LACTIC ACID, PLASMA: Lactic Acid, Venous: 1.9 mmol/L (ref 0.5–1.9)

## 2019-12-23 NOTE — ED Provider Notes (Signed)
Care assumed from Dr. Charm Barges at shift change.  Patient awaiting transportation back to her extended care facility.  She is resting comfortably and work-up thus far unremarkable with the exception of her humerus fracture.  There has been some difficulty in obtaining a urine sample and as patient appears comfortable I feel it reasonable to forego this test.  Afebrile with no white count and appears comfortable.  Her abdomen is benign.   Geoffery Lyons, MD 12/23/19 [phone]

## 2019-12-23 NOTE — ED Notes (Signed)
Transported to [facility] in [place].

## 2019-12-27 LAB — CULTURE, BLOOD (ROUTINE X 2)
Culture: NO GROWTH
Culture: NO GROWTH
Special Requests: ADEQUATE
Special Requests: ADEQUATE

## 2020-07-12 DEATH — deceased

## 2022-02-01 IMAGING — DX DG SHOULDER 2+V*R*
2 series · 2 of 2 positions shown · non-contrast
Comparison: None.

CLINICAL DATA: Shoulder pain

EXAM:
RIGHT SHOULDER - 2+ VIEW

[shoulder grashey]
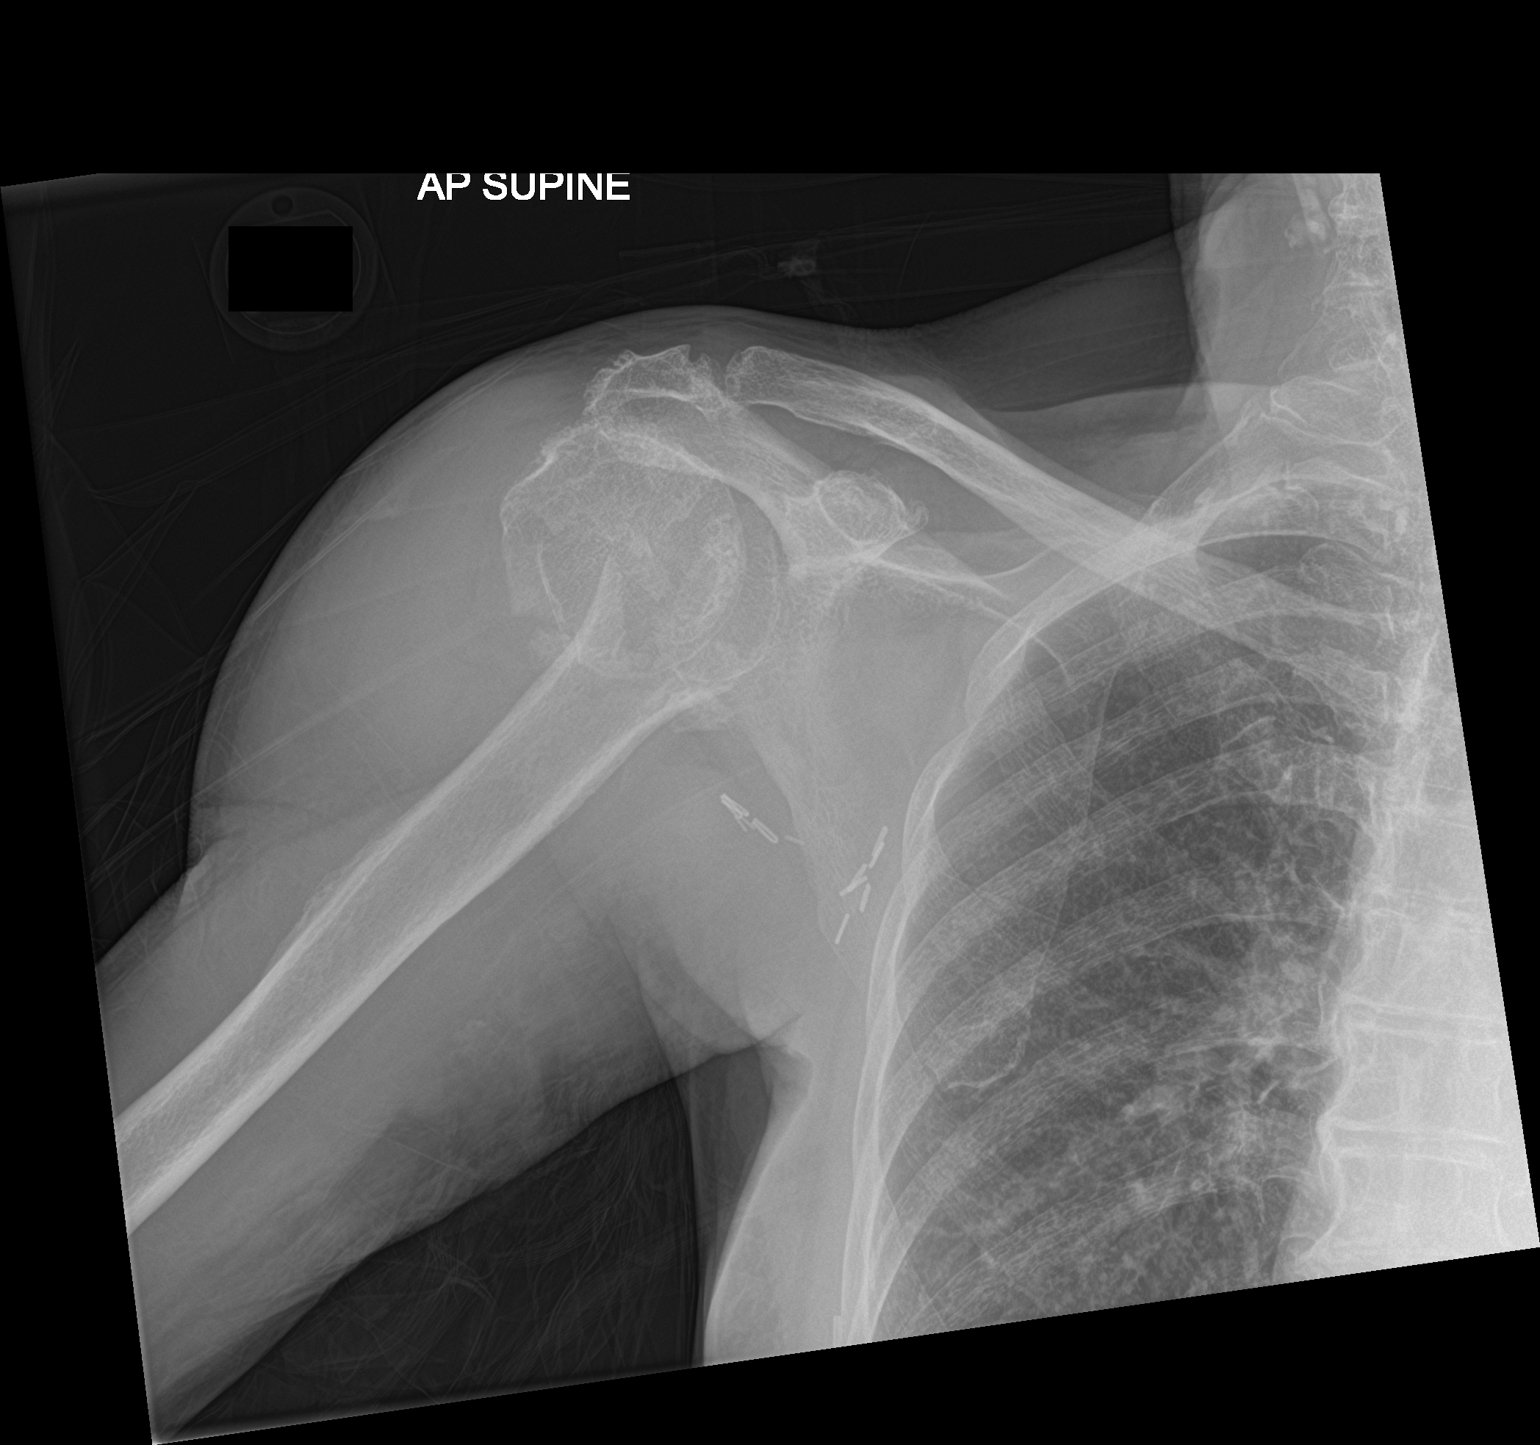

[shoulder y view]
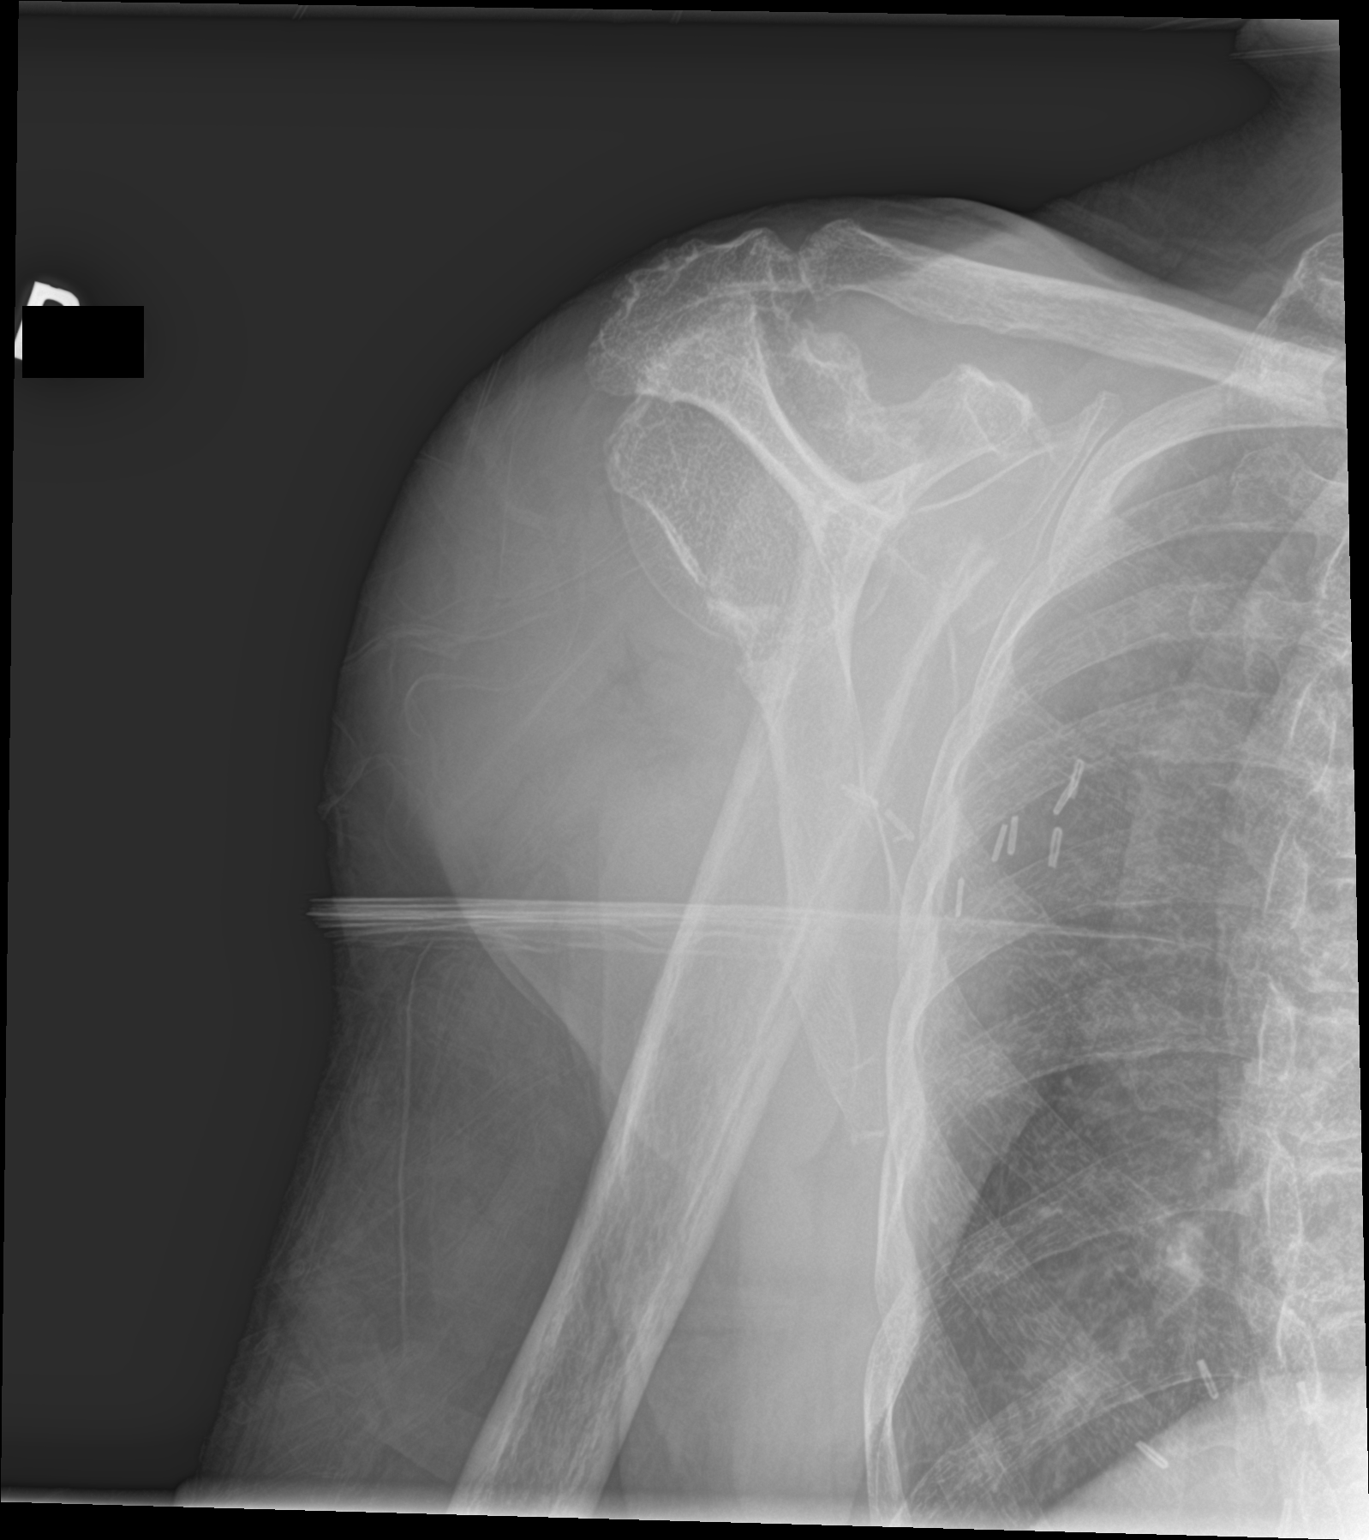

[2 of 2 positions shown; findings below may reference images not displayed]

FINDINGS: Mild AC joint degenerative change. Acute comminuted proximal humerus
fracture appears to involve the neck. Valgus angulation of distal
fracture fragment. About 1 shaft diameter anterior displacement of
the shaft of the humerus. Humeral head appears to project over the
glenoid. Clips in the right axilla
IMPRESSION: Acute comminuted, displaced and angulated proximal humerus fracture.

## 2022-02-01 IMAGING — CT CT HEAD W/O CM
3 series · 15 of 47 positions shown, 18 images · non-contrast
Comparison: Brain MRI 11/12/2016. head CT 11/11/2016.

CLINICAL DATA: Head trauma, minor. Additional provided: Patient
with dementia from [HOSPITAL], staff noted right shoulder swelling
and pain today.

EXAM:
CT HEAD WITHOUT CONTRAST
TECHNIQUE: Contiguous axial images were obtained from the base of the skull
through the vertex without intravenous contrast.

[Series 2: head w o · axial · 0.45mm/px · z∈[+1162,+1297]mm · 9 of 33 slices shown, 12 images]
[im 3/33  brain]
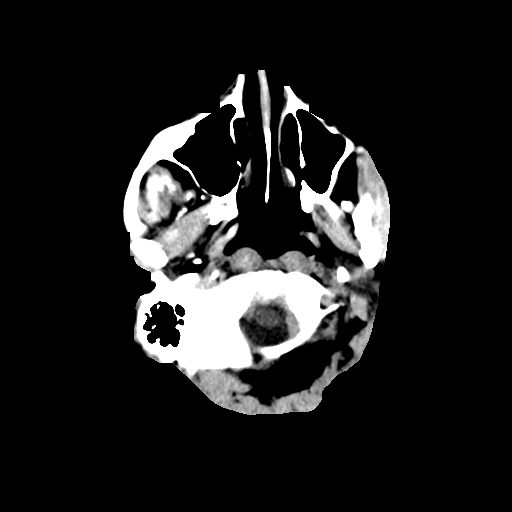
[im 3/33  bone]
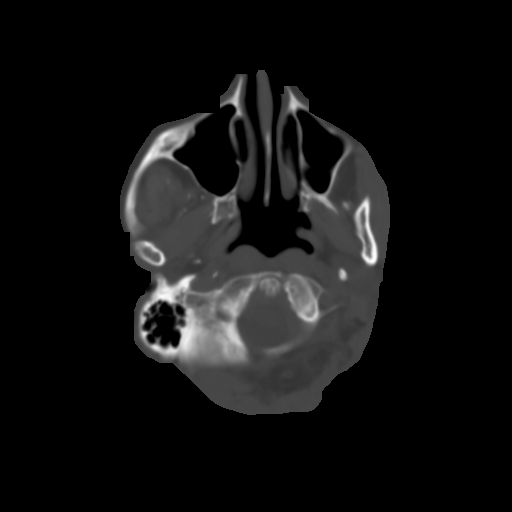
[im 6/33  brain]
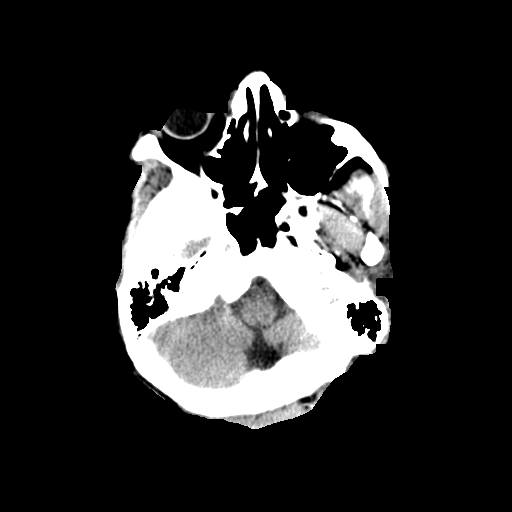
[im 9/33  brain]
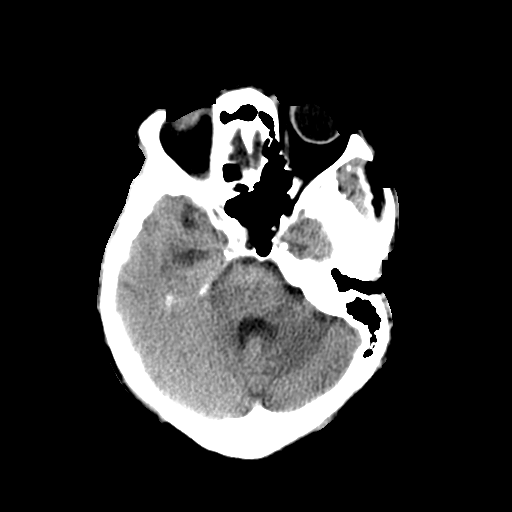
[im 13/33  brain]
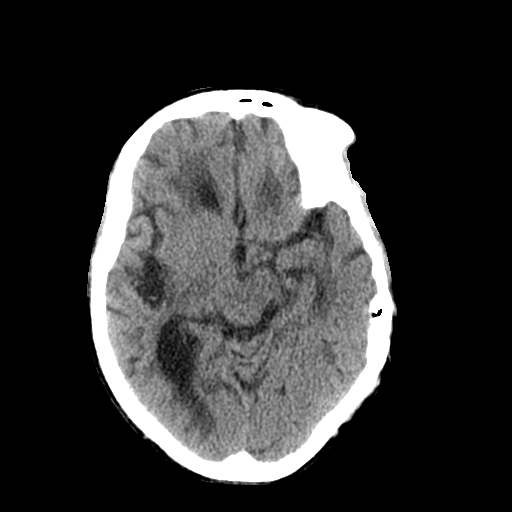
[im 17/33  brain]
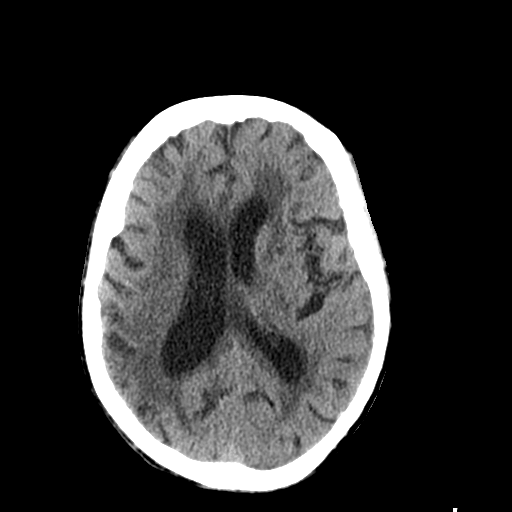
[im 17/33  bone]
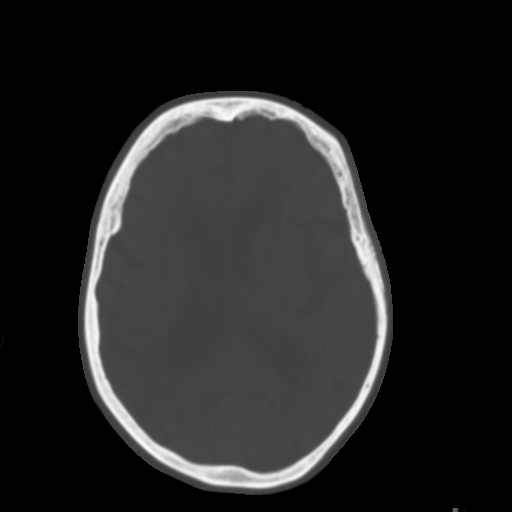
[im 20/33  brain]
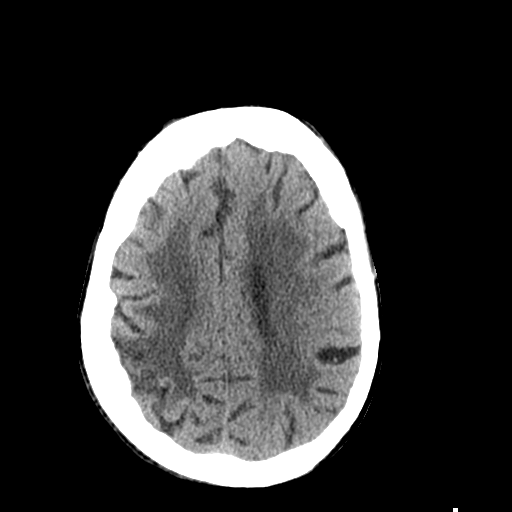
[im 24/33  brain]
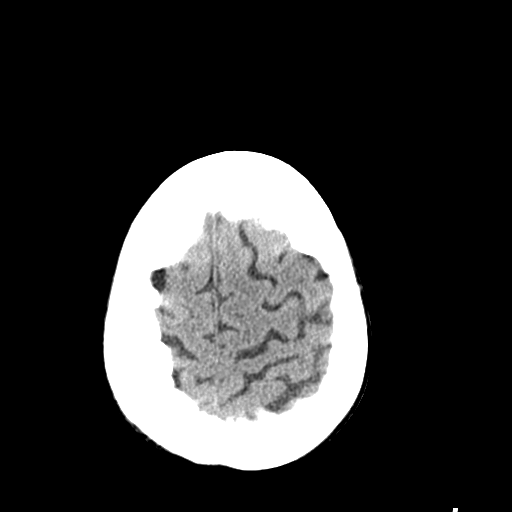
[im 27/33  brain]
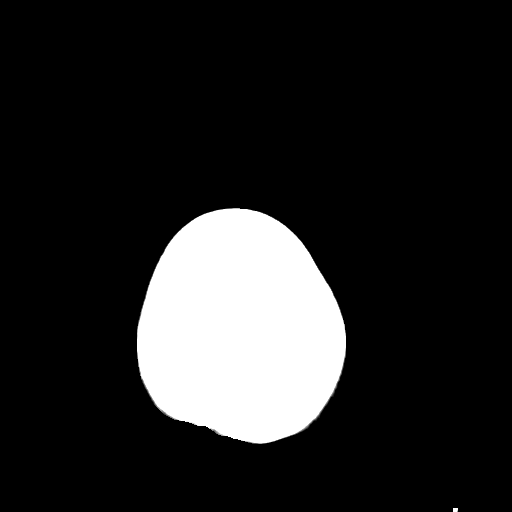
[im 30/33  brain]
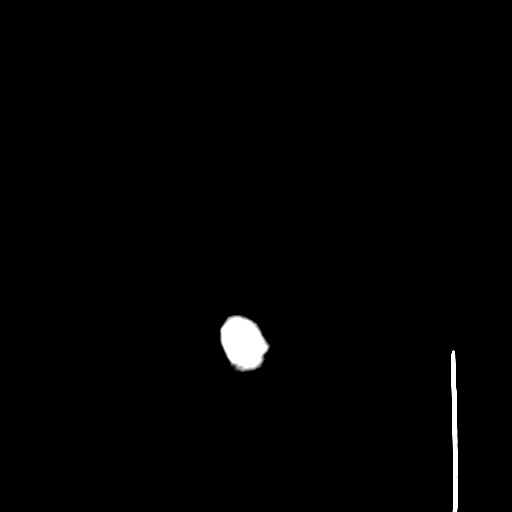
[im 30/33  bone]
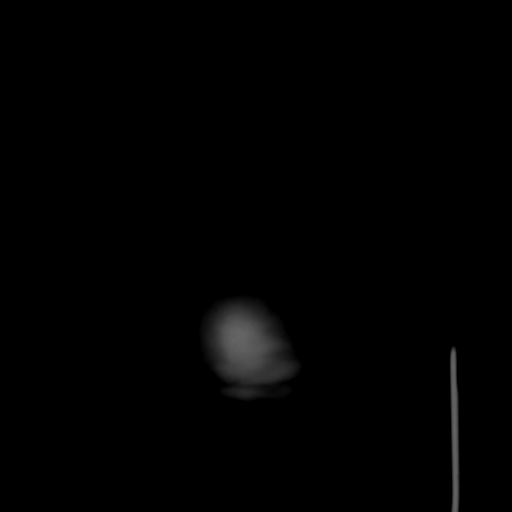

[Series 4: coronal soft · coronal · 0.31mm/px · 3 of 63 slices shown]
[im 21/63  brain]
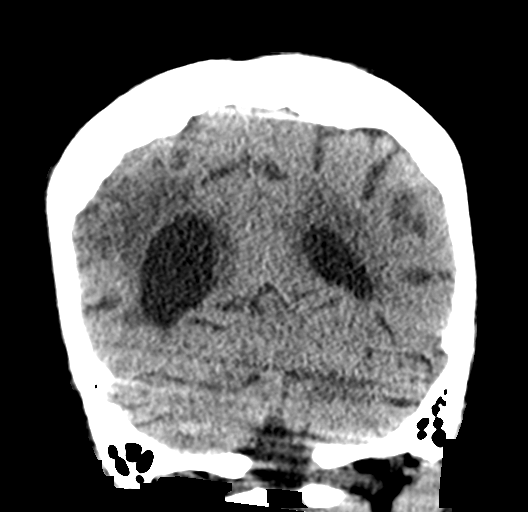
[im 28/63  brain]
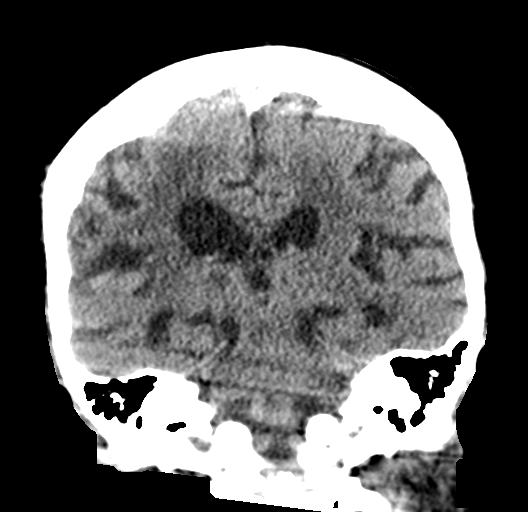
[im 35/63  brain]
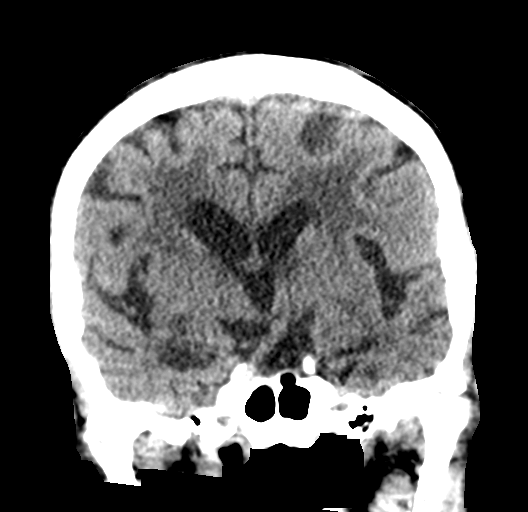

[Series 5: sagittal soft · sagittal · 0.31mm/px · 3 of 50 slices shown]
[im 17/50  brain]
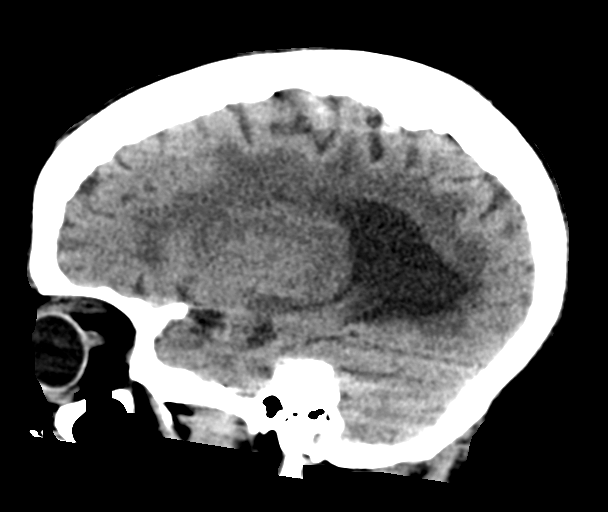
[im 25/50  brain]
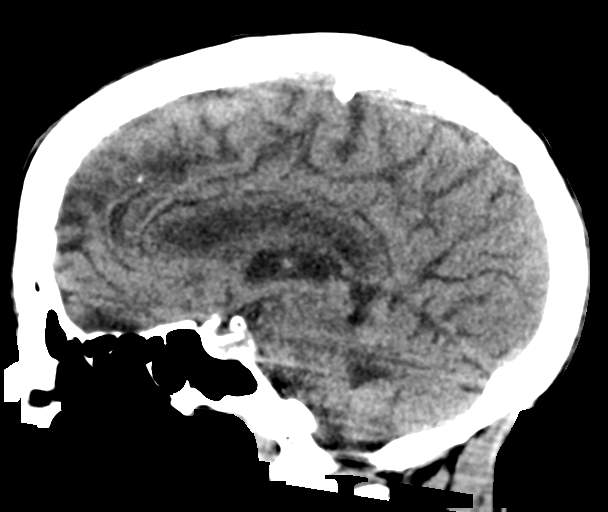
[im 33/50  brain]
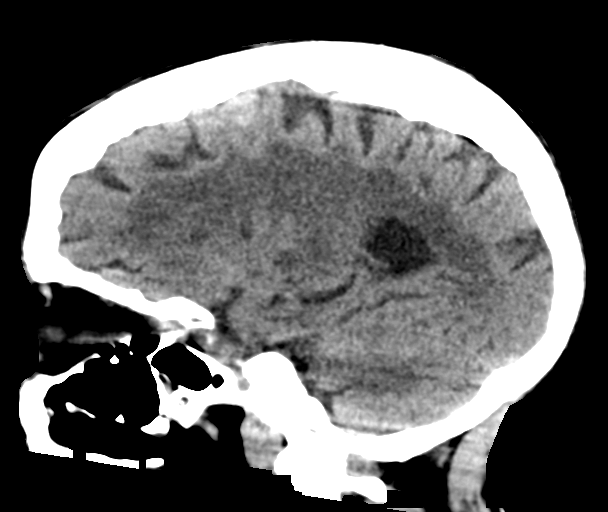

[15 of 47 positions shown; findings below may reference images not displayed]

FINDINGS: Brain:

Moderate generalized parenchymal atrophy, progressed as compared to
the prior MRI of 11/12/2016.

There is a chronic cortically based infarct within the right
parietal lobe and right temporoparietal junction which is new as
compared to the prior MRI.

Progressive advanced ill-defined hypoattenuation within the cerebral
white matter which is nonspecific, but consistent with chronic small
vessel ischemic disease

Additionally, multiple chronic lacunar infarcts within the deep gray
nuclei have progressed in number as compared to this prior exam.

There is no acute intracranial hemorrhage.

No acute demarcated cortical infarct.

No extra-axial fluid collection.

No evidence of intracranial mass.

No midline shift.

Vascular: No hyperdense vessel.  Atherosclerotic calcifications

Skull: Normal. Negative for fracture or focal lesion.

Sinuses/Orbits: Visualized orbits show no acute finding. No
significant paranasal sinus disease or mastoid effusion at the
imaged levels
IMPRESSION: No evidence of acute intracranial abnormality.

Chronic cortically based infarct within the right parietal lobe and
right temporoparietal junction, new as compared to the prior MRI of
11/12/2016.

Additionally, chronic deep gray nuclei lacunar infarcts have
progressed in number since this prior examination.

Progressive background moderate generalized parenchymal atrophy and
advanced cerebral white matter disease.
